# Patient Record
Sex: Male | Born: 1940 | Race: White | Hispanic: No | Marital: Married
Health system: Southern US, Community
[De-identification: ages and names within clinical notes are randomized; demographics above are authoritative.]

---

## 2010-05-16 ENCOUNTER — Encounter: Admission: RE | Admit: 2010-05-16 | Discharge: 2010-05-16 | Payer: Self-pay | Admitting: Orthopedic Surgery

## 2011-07-24 ENCOUNTER — Telehealth (INDEPENDENT_AMBULATORY_CARE_PROVIDER_SITE_OTHER): Payer: Self-pay

## 2011-07-24 NOTE — Telephone Encounter (Signed)
Pt's wife called.  He has a very dry, sore throat.  His surgery was this morning.  I recommended small sips of water and Dr. Dwain Sarna suggested Cepacol lozenges and gargling with salt water.

## 2013-09-08 ENCOUNTER — Other Ambulatory Visit: Payer: Self-pay | Admitting: Ophthalmology

## 2013-09-08 DIAGNOSIS — H5712 Ocular pain, left eye: Secondary | ICD-10-CM

## 2013-09-12 ENCOUNTER — Ambulatory Visit
Admission: RE | Admit: 2013-09-12 | Discharge: 2013-09-12 | Disposition: A | Discharge status: Home / Self Care | Payer: Medicare Other | Source: Ambulatory Visit | Attending: Ophthalmology | Admitting: Ophthalmology

## 2013-09-12 ENCOUNTER — Inpatient Hospital Stay
Admission: RE | Admit: 2013-09-12 | Discharge: 2013-09-12 | Disposition: A | Discharge status: Home / Self Care | Payer: Self-pay | Source: Ambulatory Visit | Attending: Ophthalmology | Admitting: Ophthalmology

## 2013-09-12 DIAGNOSIS — H5712 Ocular pain, left eye: Secondary | ICD-10-CM

## 2013-09-12 MED ORDER — IOHEXOL 300 MG/ML  SOLN
75.0000 mL | Freq: Once | INTRAMUSCULAR | Status: AC | PRN
  Administered 2013-09-12: 75 mL via INTRAVENOUS

## 2015-04-18 IMAGING — CT CT MAXILLOFACIAL W/ CM
3 of 5 series · 15 of 47 positions shown, 18 images · IV contrast (75CC OMNI 300)
Comparison: None.

CLINICAL DATA: 72-year-old male with left globe and orbit pain x2
months. Pain with eye movement. Recently treated with antibiotics
for sinus infection. Initial encounter.

BUN and creatinine were obtained on site at [HOSPITAL] at
[HOSPITAL].
Results:  BUN 15 mg/dL,  Creatinine 1.0 mg/dL.
EXAM:
CT MAXILLOFACIAL WITH CONTRAST
TECHNIQUE: Multidetector CT imaging of the maxillofacial structures was
performed with intravenous contrast. Multiplanar CT image
reconstructions were also generated. A small metallic BB was placed
on the right temple in order to reliably differentiate right from
left.
CONTRAST:  75mL OMNIPAQUE IOHEXOL 300 MG/ML  SOLN

[Series 3: max bone · axial · 0.33mm/px · z∈[-115,+40]mm · 10 of 74 slices shown, 13 images]
[im 6/74  brain]
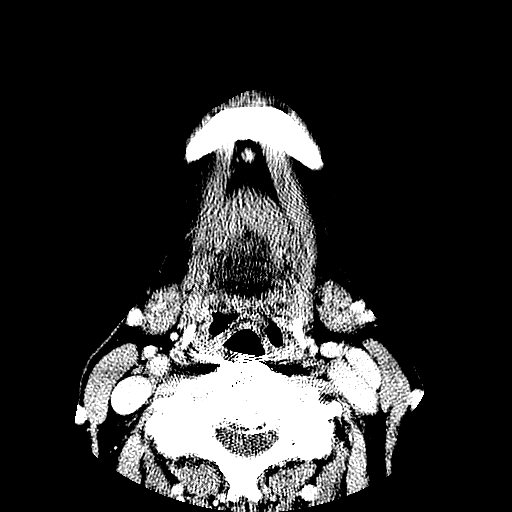
[im 6/74  bone]
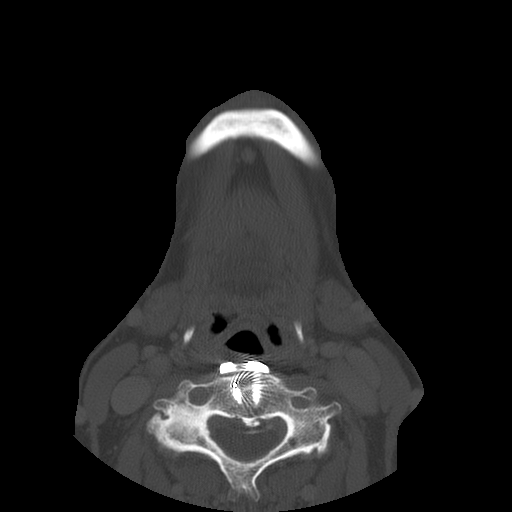
[im 11/74  bone]
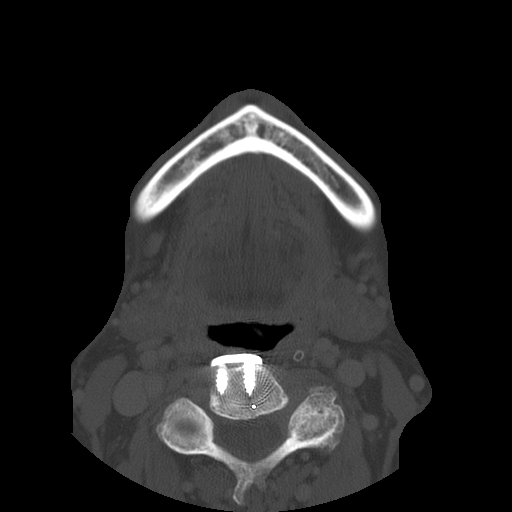
[im 21/74  bone]
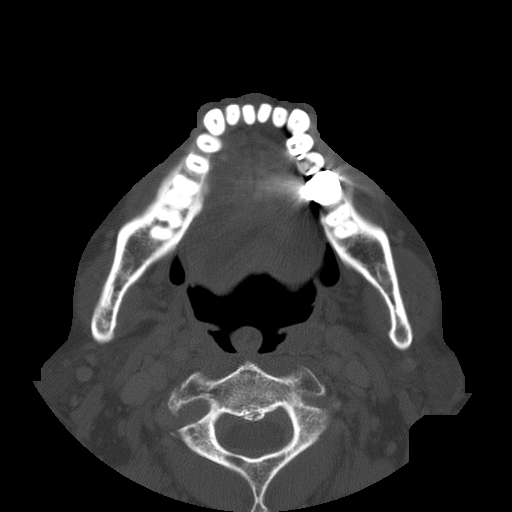
[im 27/74  bone]
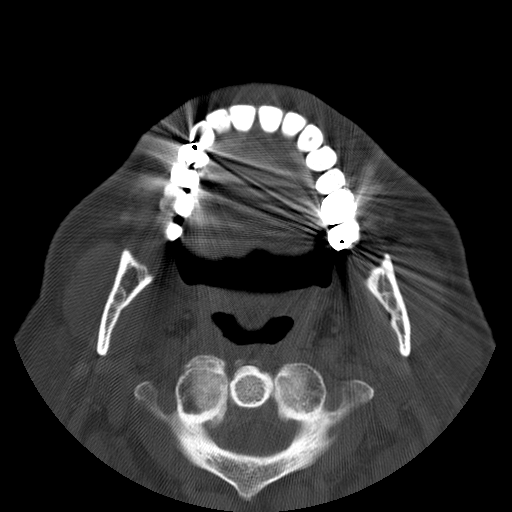
[im 32/74  brain]
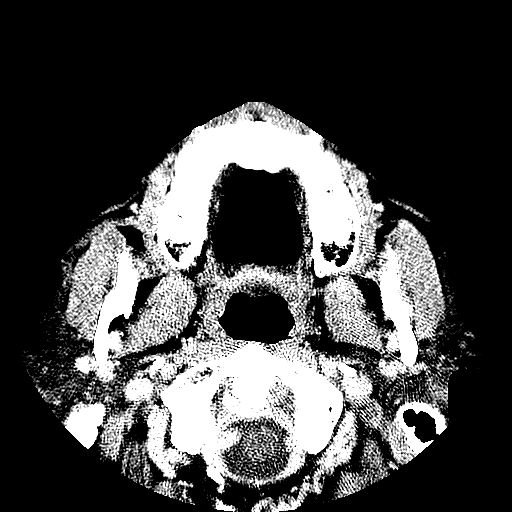
[im 32/74  bone]
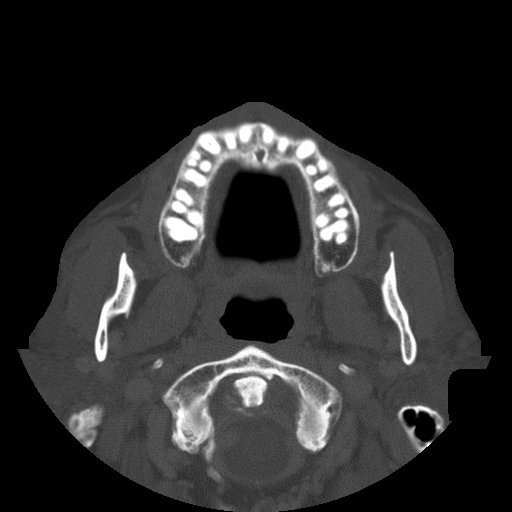
[im 42/74  bone]
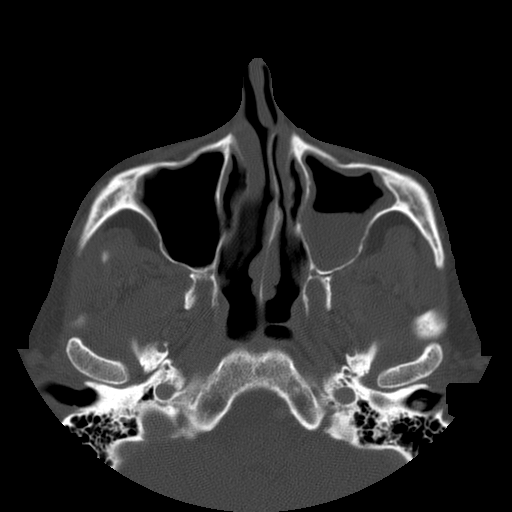
[im 47/74  bone]
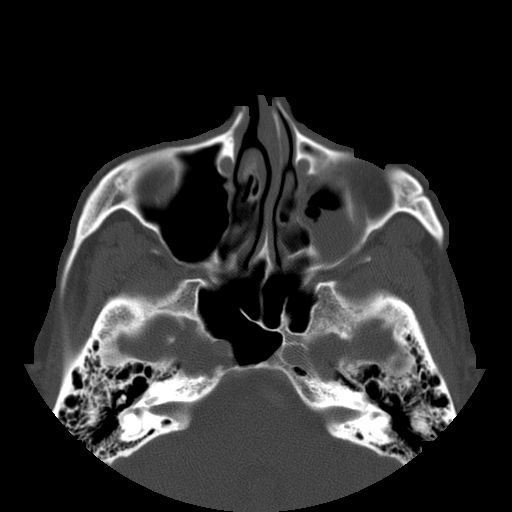
[im 53/74  bone]
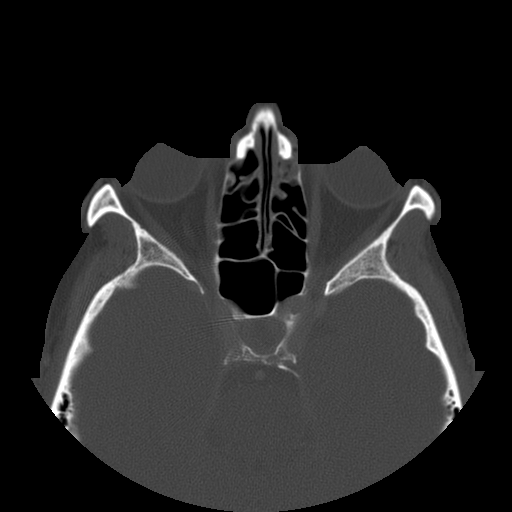
[im 63/74  brain]
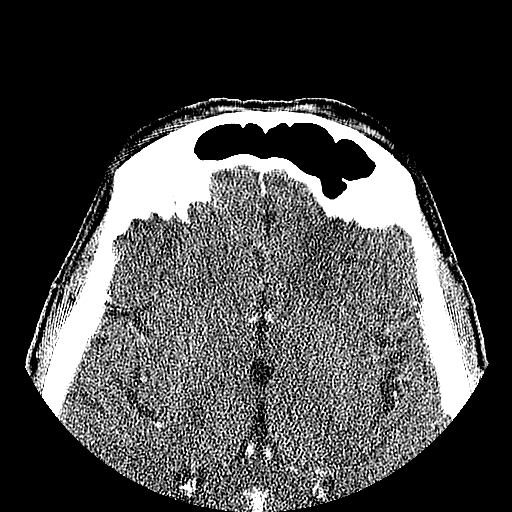
[im 63/74  bone]
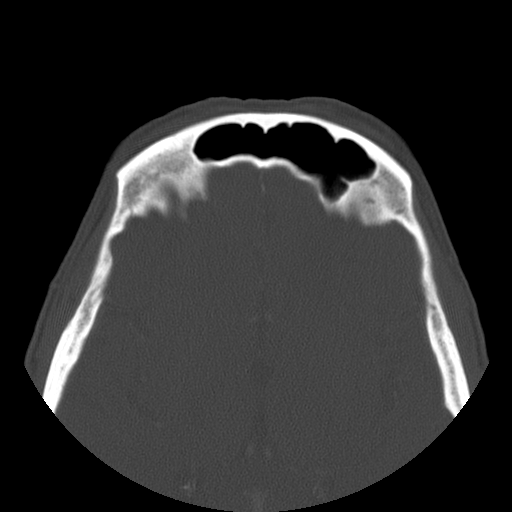
[im 68/74  bone]
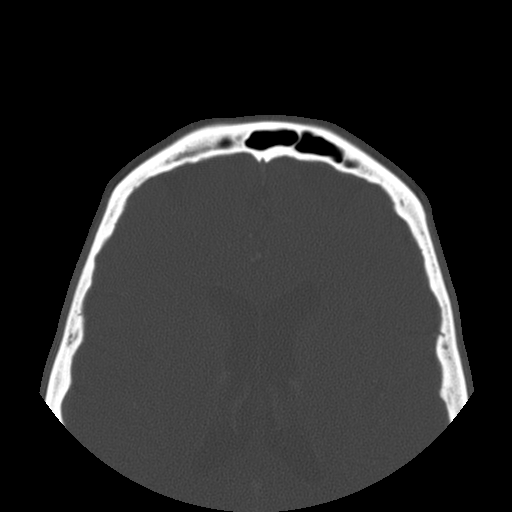

[Series 104: sag detail · sagittal · 0.37mm/px · 2 of 77 slices shown]
[im 26/77  bone]
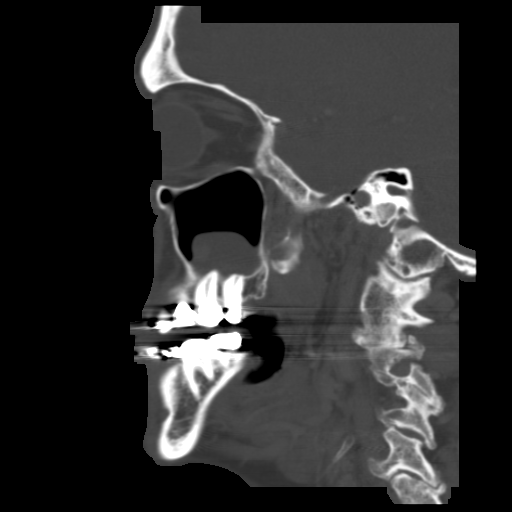
[im 51/77  bone]
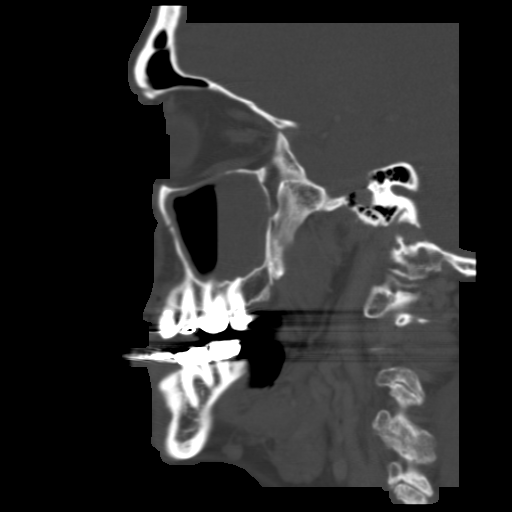

[Series 400: cor bone · coronal · 0.37mm/px · 3 of 81 slices shown]
[im 27/81  bone]
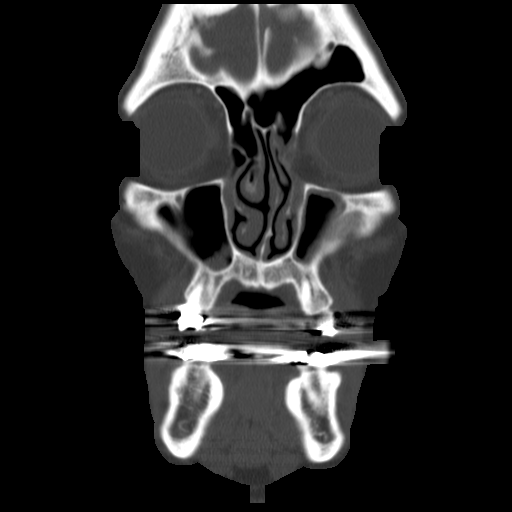
[im 36/81  bone]
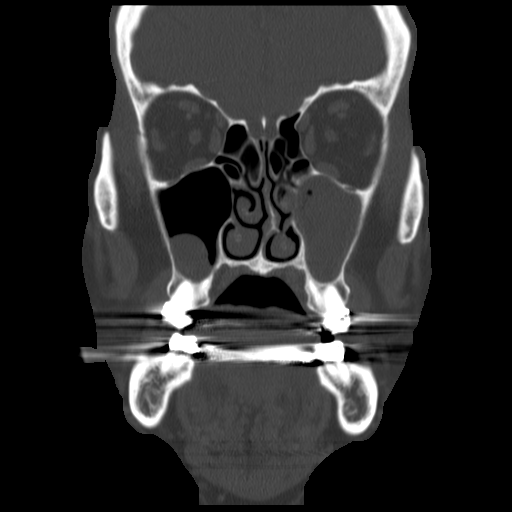
[im 45/81  bone]
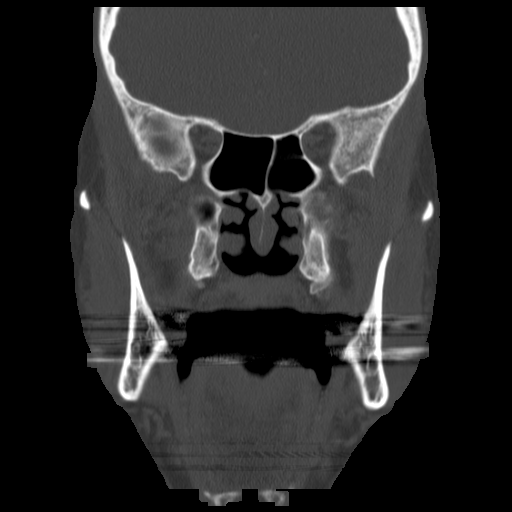

[15 of 47 positions shown; findings below may reference images not displayed]

FINDINGS: Negative visualized brain parenchyma. Major vascular structures in
the face and at the skullbase are patent. Dominant distal right
vertebral artery. Outside of the orbits, negative visualized deep
soft tissue spaces of the face. Previous C3-C4 cervical ACDF with
hardware in place. Fairly advanced degenerative changes in the
visible cervical spine, including ossification of the posterior
longitudinal ligament (sagittal image 38).

Tympanic cavities and mastoids are clear. There is a low-density
fluid level in the left maxillary sinus plus mild left maxillary
circumferential mucosal thickening. Small probable mucous retention
cysts in the right maxillary sinus. Minimal anterior ethmoid sinus
mucosal thickening. Other paranasal sinuses are clear.

Both globes appear symmetric and intact. Extraocular muscles appear
symmetric and normal. No intraorbital inflammatory stranding or
mass. Negative CT appearance of both optic nerves. No periorbital
inflammation. Minimal asymmetry in the region of the left orbital
apex and cavernous sinus (series 2, image 22) is felt to represent
normal asymmetry of the left sphenoparietal dural venous sinus. No
suprasellar mass or mass effect identified.

No acute osseous abnormality.
IMPRESSION: 1. Orbits soft tissues appear normal. There is subtle asymmetry of
the left cavernous sinus near the left orbital apex, but this is
felt related to normal dural venous anatomy. If symptoms persist or
worsen, a repeat CT or alternatively an orbit MRI without and with
contrast is recommended with attention to this area.
2. Evidence of acute left maxillary sinusitis. Minimal paranasal
sinus inflammatory changes elsewhere. No acute osseous abnormality
or complicating features.
3. Advanced degenerative changes in the visualized upper cervical
spine, including ossification of the posterior longitudinal
ligament, and previous ACDF.
